# Patient Record
Sex: Male | Born: 1990 | Race: Black or African American | Hispanic: No | Marital: Single | Smoking: Current every day smoker
Health system: Southern US, Community
[De-identification: ages and names within clinical notes are randomized; demographics above are authoritative.]

---

## 2016-06-11 ENCOUNTER — Emergency Department (HOSPITAL_COMMUNITY): Payer: BLUE CROSS/BLUE SHIELD

## 2016-06-11 ENCOUNTER — Emergency Department (HOSPITAL_COMMUNITY)
Admission: EM | Admit: 2016-06-11 | Discharge: 2016-06-11 | Disposition: A | Discharge status: Home / Self Care | Payer: BLUE CROSS/BLUE SHIELD | Attending: Emergency Medicine | Admitting: Emergency Medicine

## 2016-06-11 ENCOUNTER — Encounter (HOSPITAL_COMMUNITY): Payer: Self-pay | Admitting: Nurse Practitioner

## 2016-06-11 DIAGNOSIS — Y929 Unspecified place or not applicable: Secondary | ICD-10-CM | POA: Insufficient documentation

## 2016-06-11 DIAGNOSIS — Y9389 Activity, other specified: Secondary | ICD-10-CM | POA: Insufficient documentation

## 2016-06-11 DIAGNOSIS — S6992XA Unspecified injury of left wrist, hand and finger(s), initial encounter: Secondary | ICD-10-CM | POA: Diagnosis present

## 2016-06-11 DIAGNOSIS — F17200 Nicotine dependence, unspecified, uncomplicated: Secondary | ICD-10-CM | POA: Diagnosis not present

## 2016-06-11 DIAGNOSIS — S62609B Fracture of unspecified phalanx of unspecified finger, initial encounter for open fracture: Secondary | ICD-10-CM

## 2016-06-11 DIAGNOSIS — Y990 Civilian activity done for income or pay: Secondary | ICD-10-CM | POA: Diagnosis not present

## 2016-06-11 DIAGNOSIS — M795 Residual foreign body in soft tissue: Secondary | ICD-10-CM

## 2016-06-11 DIAGNOSIS — S62611B Displaced fracture of proximal phalanx of left index finger, initial encounter for open fracture: Secondary | ICD-10-CM | POA: Diagnosis not present

## 2016-06-11 DIAGNOSIS — W268XXA Contact with other sharp object(s), not elsewhere classified, initial encounter: Secondary | ICD-10-CM | POA: Insufficient documentation

## 2016-06-11 DIAGNOSIS — Z23 Encounter for immunization: Secondary | ICD-10-CM | POA: Insufficient documentation

## 2016-06-11 MED ORDER — TETANUS-DIPHTH-ACELL PERTUSSIS 5-2.5-18.5 LF-MCG/0.5 IM SUSP
0.5000 mL | Freq: Once | INTRAMUSCULAR | Status: AC
  Administered 2016-06-11: 0.5 mL via INTRAMUSCULAR
  Filled 2016-06-11: qty 0.5

## 2016-06-11 MED ORDER — CIPROFLOXACIN HCL 500 MG PO TABS: 500.0000 mg | ORAL_TABLET | Freq: Two times a day (BID) | ORAL | 0 refills | Status: AC

## 2016-06-11 MED ORDER — LIDOCAINE HCL 2 % IJ SOLN
20.0000 mL | Freq: Once | INTRAMUSCULAR | Status: AC
Start: 2016-06-11 — End: 2016-06-11
  Administered 2016-06-11: 400 mg
  Filled 2016-06-11: qty 20

## 2016-06-11 MED ORDER — CIPROFLOXACIN HCL 500 MG PO TABS
500.0000 mg | ORAL_TABLET | Freq: Once | ORAL | Status: AC
  Administered 2016-06-11: 500 mg via ORAL
  Filled 2016-06-11: qty 1

## 2016-06-11 MED ORDER — BACITRACIN ZINC 500 UNIT/GM EX OINT
TOPICAL_OINTMENT | CUTANEOUS | Status: DC | PRN
  Administered 2016-06-11: 19:00:00 via TOPICAL
  Filled 2016-06-11: qty 15

## 2016-06-11 MED ORDER — BACITRACIN ZINC 500 UNIT/GM EX OINT
TOPICAL_OINTMENT | CUTANEOUS | Status: AC
  Filled 2016-06-11: qty 0.9

## 2016-06-11 NOTE — ED Notes (Signed)
Pt ambulated to XRay

## 2016-06-11 NOTE — ED Provider Notes (Signed)
[facility]-EMERGENCY DEPT Provider Note   CSN: [id number] Arrival date & time: 06/11/16  1622  By signing my name below, I, Phillis HaggisGabriella Gaje, attest that this documentation has been prepared under the direction and in the presence of Newell RubbermaidJeffrey Bradshaw Minihan, PA-C. Electronically Signed: Phillis HaggisGabriella Gaje, ED Scribe. 06/11/16. 4:45 PM.   History   Chief Complaint Chief Complaint  Patient presents with  . Finger Injury    Left Index   The history is provided by the patient. No language interpreter was used.  HPI Comments: Jonathan Gentry is a 25 y.o. male who presents to the Emergency Department complaining of a left index finger injury onset PTA. Pt states that he was using an [employer] stapler at work when a large staple went through the finger. There is no active bleeding. He currently denies pain, numbness, or weakness. Pt is not utd on tdap.   History reviewed. No pertinent past medical history.  There are no active problems to display for this patient.   History reviewed. No pertinent surgical history.   Home Medications    Prior to Admission medications   Medication Sig Start Date End Date Taking? Authorizing Provider  ciprofloxacin (CIPRO) 500 MG tablet Take 1 tablet (500 mg total) by mouth 2 (two) times daily. 06/11/16   Eyvonne MechanicJeffrey Dawit Tankard, PA-C    Family History No family history on file.  Social History Social History  Substance Use Topics  . Smoking status: Current Every Day Smoker  . Smokeless tobacco: Never Used  . Alcohol use Yes     Allergies   Review of patient's allergies indicates no known allergies.   Review of Systems Review of Systems  All other systems reviewed and are negative.   Physical Exam Updated Vital Signs BP 131/75   Pulse 71   Temp 98.8 F (37.1 C) (Oral)   SpO2 97%   Physical Exam  Constitutional: He is oriented to person, place, and time. He appears well-developed and well-nourished.  HENT:  Head: Normocephalic and atraumatic.  Eyes:  Conjunctivae and EOM are normal. Pupils are equal, round, and reactive to light.  Neck: Normal range of motion. Neck supple.  Musculoskeletal: Normal range of motion.  Neurological: He is alert and oriented to person, place, and time.  Skin: Skin is warm and dry.  Psychiatric: He has a normal mood and affect. His behavior is normal.  Nursing note and vitals reviewed.    ED Treatments / Results  DIAGNOSTIC STUDIES: Oxygen Saturation is 100 by my interpretation.    COORDINATION OF CARE: 4:42 PM-Discussed treatment plan which includes x-ray, digital block and removal of foreign body with pt at bedside and pt agreed to plan.    Labs (all labs ordered are listed, but only abnormal results are displayed) Labs Reviewed - No data to display  EKG  EKG Interpretation None       Radiology Dg Finger Index Left  Result Date: 06/11/2016 CLINICAL DATA:  Post foreign body removal EXAM: LEFT INDEX FINGER 2+V COMPARISON:  Earlier study 06/11/2016 FINDINGS: Metal staple has been removed since the previous study. Osseous mineralization normal. Joint spaces preserved. Tiny fracture fragment identified at the site of foreign body removal at the distal radial aspect, proximal phalanx index finger. No additional fractures identified. Soft tissue swelling LEFT index finger. IMPRESSION: Tiny fracture fragment identified at site of foreign body removal at the distal radial aspect of the proximal phalanx LEFT index finger. Electronically Signed   By: Ulyses SouthwardMark  Boles M.D.   On: 06/11/2016 18:20  Dg Finger Index Left  Result Date: 06/11/2016 CLINICAL DATA:  Stable injury to index finger today. Initial encounter. EXAM: LEFT INDEX FINGER 2+V COMPARISON:  None. FINDINGS: One of the legs of a large metallic staple is identified within the index finger. This overlaps the proximal phalanx but there is no evidence of fracture. The joint space is unremarkable. IMPRESSION: Large metallic staple within the index finger.  Although it does overlap the proximal phalanx, there is no evidence of fracture and may strictly lie within the soft tissues. Electronically Signed   By: Harmon Pier M.D.   On: 06/11/2016 17:38    Procedures .Foreign Body Removal Date/Time: 06/11/2016 4:44 PM Performed by: Curlene Dolphin, Masashi Snowdon Authorized by: Curlene Dolphin, Makia Bossi  Consent: Verbal consent obtained. Risks and benefits: risks, benefits and alternatives were discussed Consent given by: patient Patient understanding: patient states understanding of the procedure being performed Imaging studies: imaging studies available Patient identity confirmed: verbally with patient Intake: Left index finger. Anesthesia: digital block  Anesthesia: Local Anesthetic: lidocaine 2% without epinephrine  Sedation: Patient sedated: no 1 objects recovered. Objects recovered: Staple Post-procedure assessment: foreign body removed Patient tolerance: Patient tolerated the procedure well with no immediate complications   (including critical care time)  Medications Ordered in ED Medications  bacitracin ointment ( Topical Given 06/11/16 1836)  Tdap (BOOSTRIX) injection 0.5 mL (0.5 mLs Intramuscular Given 06/11/16 1652)  lidocaine (XYLOCAINE) 2 % (with pres) injection 400 mg (400 mg Infiltration Given 06/11/16 1655)  ciprofloxacin (CIPRO) tablet 500 mg (500 mg Oral Given 06/11/16 1836)     Initial Impression / Assessment and Plan / ED Course  I have reviewed the triage vital signs and the nursing notes.  Pertinent labs & imaging results that were available during my care of the patient were reviewed by me and considered in my medical decision making (see chart for details).  Clinical Course    Final Clinical Impressions(s) / ED Diagnoses   Final diagnoses:  Foreign body (FB) in soft tissue  Finger fracture, open, initial encounter   Labs:  Imaging:  Dg Finger Index Left  Result Date: 06/11/2016 CLINICAL DATA:  Post foreign body removal EXAM:  LEFT INDEX FINGER 2+V COMPARISON:  Earlier study 06/11/2016 FINDINGS: Metal staple has been removed since the previous study. Osseous mineralization normal. Joint spaces preserved. Tiny fracture fragment identified at the site of foreign body removal at the distal radial aspect, proximal phalanx index finger. No additional fractures identified. Soft tissue swelling LEFT index finger. IMPRESSION: Tiny fracture fragment identified at site of foreign body removal at the distal radial aspect of the proximal phalanx LEFT index finger. Electronically Signed   By: Ulyses Southward M.D.   On: 06/11/2016 18:20   Dg Finger Index Left  Result Date: 06/11/2016 CLINICAL DATA:  Stable injury to index finger today. Initial encounter. EXAM: LEFT INDEX FINGER 2+V COMPARISON:  None. FINDINGS: One of the legs of a large metallic staple is identified within the index finger. This overlaps the proximal phalanx but there is no evidence of fracture. The joint space is unremarkable. IMPRESSION: Large metallic staple within the index finger. Although it does overlap the proximal phalanx, there is no evidence of fracture and may strictly lie within the soft tissues. Electronically Signed   By: Harmon Pier M.D.   On: 06/11/2016 17:38   Consults:  Therapeutics:  Discharge Meds:   Assessment/Plan: 25 year old male presents today with staple through his finger. Initial x-ray showed no fracture, staple was removed, and repeat x-ray showed  small fracture. Patient's wound was irrigated with normal saline and water. He is given a dose of Cipro, discharged home on 5 days of Cipro prophylactic antibiotic. Patient's encouraged to follow up with hand surgery for reevaluation. Strict return precautions given. Patient had no neuro deficits, full active range of motion of the finger.  I personally performed the services described in this documentation, which was scribed in my presence. The recorded information has been reviewed and is  accurate.   New Prescriptions Discharge Medication List as of 06/11/2016  6:33 PM    START taking these medications   Details  ciprofloxacin (CIPRO) 500 MG tablet Take 1 tablet (500 mg total) by mouth 2 (two) times daily., Starting Sun 06/11/2016, Print         Eyvonne Mechanic, PA-C 06/11/16 1900    Rolland Porter, MD 06/11/16 2311

## 2016-06-11 NOTE — Discharge Instructions (Signed)
Please read attached information. If you experience any new or worsening signs or symptoms please return to the emergency room for evaluation. Please follow-up with your primary care provider or specialist as discussed. Please use medication prescribed only as directed and discontinue taking if you have any concerning signs or symptoms.   °

## 2016-06-11 NOTE — ED Triage Notes (Signed)
Pt presents with a large/long stapler nail on his left index. Sensation to the affected finger intact.

## 2016-06-11 NOTE — ED Provider Notes (Signed)
Pt D/W Jonathan Gentry. Patient examined. Recommend repeat x-ray after staple removal. After staple removal he has better range of motion. Post x-ray shows possible small fracture. No joint involved. Patient had been previously discussed by Burna FortsJeff Gentry with hand surgeon Dr. Janee Mornhompson. Patient be covered with antibiotics and follow-up with Dr. Janee Mornhompson as needed.  Cc additional note by Burna FortsJeff Hedges PA-C.   Rolland PorterMark Illya Gienger, MD 06/11/16 [phone]

## 2018-02-18 IMAGING — CR DG FINGER INDEX 2+V*L*
3 series · 3 of 3 positions shown · non-contrast
Comparison: Earlier study 06/11/2016

CLINICAL DATA: Post foreign body removal

EXAM:
LEFT INDEX FINGER 2+V

[x finger pa left]
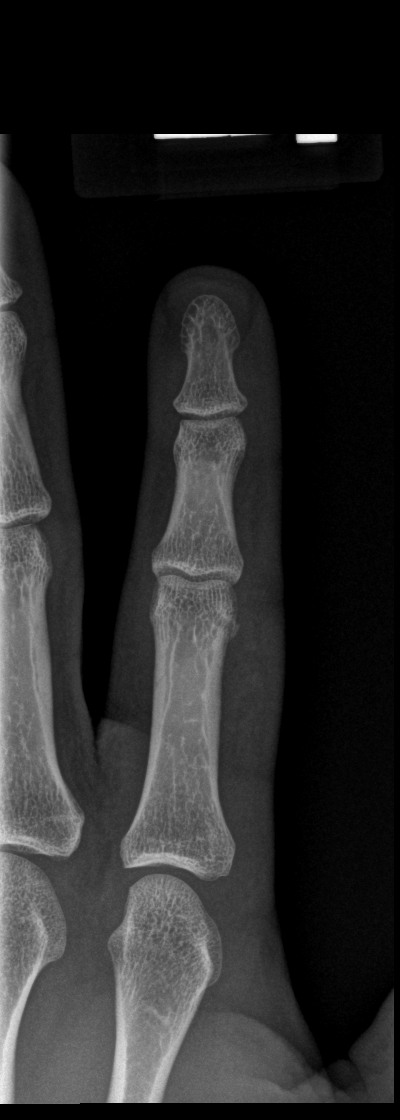

[x finger obl left]
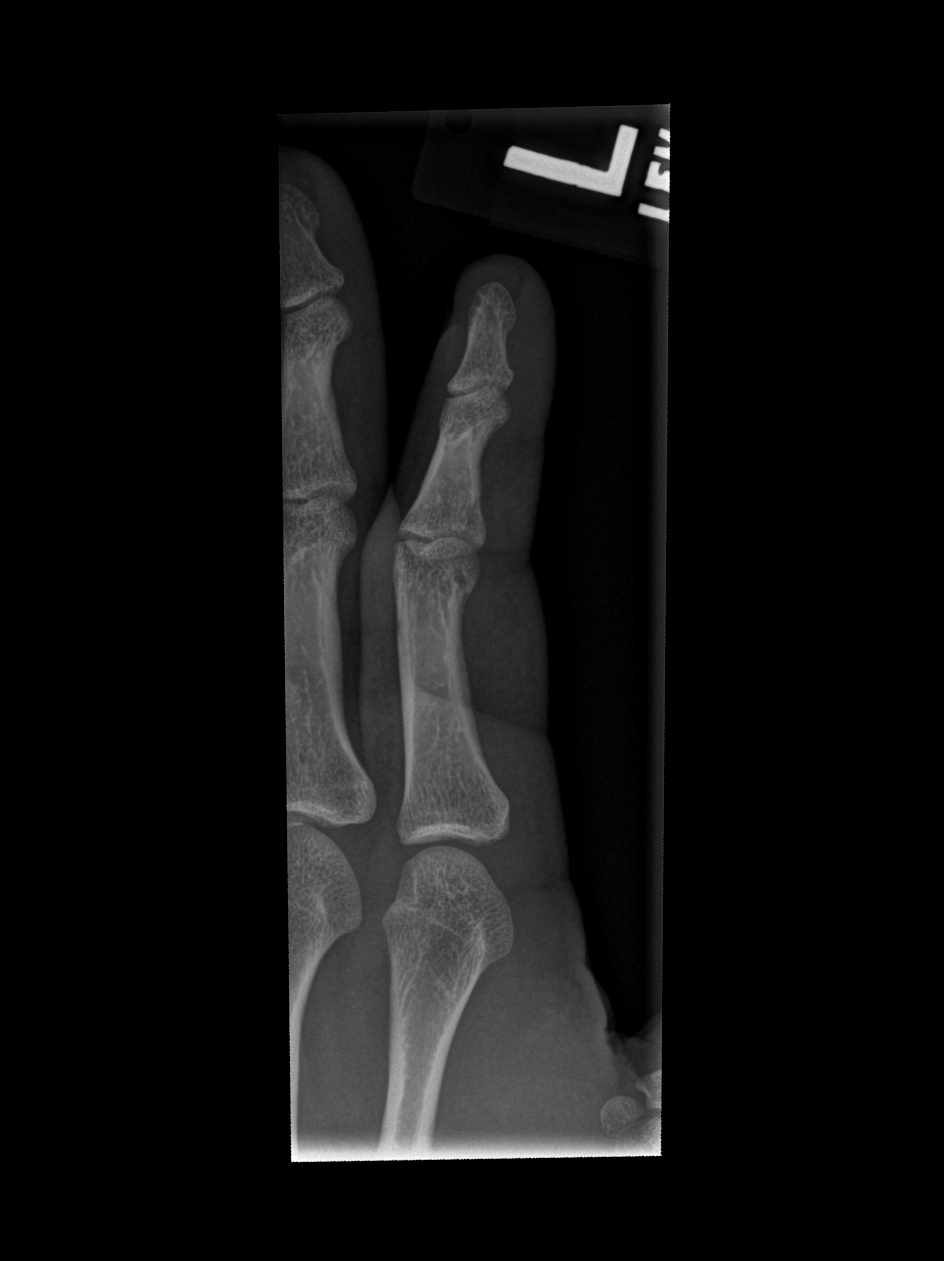

[x finger lat left]
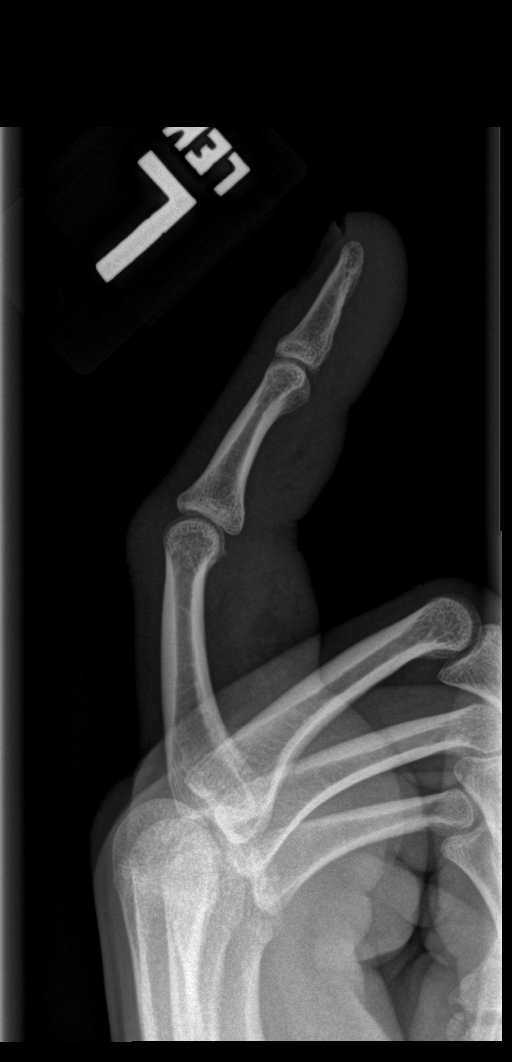

[3 of 3 positions shown; findings below may reference images not displayed]

FINDINGS: Metal staple has been removed since the previous study.

Osseous mineralization normal.

Joint spaces preserved.

Tiny fracture fragment identified at the site of foreign body
removal at the distal radial aspect, proximal phalanx index finger.

No additional fractures identified.

Soft tissue swelling LEFT index finger.
IMPRESSION: Tiny fracture fragment identified at site of foreign body removal at
the distal radial aspect of the proximal phalanx LEFT index finger.
